# Patient Record
Sex: Female | Born: 1955 | Hispanic: No | Marital: Single | Smoking: Never smoker
Health system: Southern US, Community
[De-identification: ages and names within clinical notes are randomized; demographics above are authoritative.]

## PROBLEM LIST (undated history)

## (undated) HISTORY — PX: APPENDECTOMY: SHX54

---

## 1999-04-11 ENCOUNTER — Other Ambulatory Visit: Admission: RE | Admit: 1999-04-11 | Discharge: 1999-04-11 | Payer: Self-pay | Admitting: Obstetrics and Gynecology

## 2001-03-16 ENCOUNTER — Other Ambulatory Visit: Admission: RE | Admit: 2001-03-16 | Discharge: 2001-03-16 | Payer: Self-pay | Admitting: Obstetrics and Gynecology

## 2002-08-25 ENCOUNTER — Other Ambulatory Visit: Admission: RE | Admit: 2002-08-25 | Discharge: 2002-08-25 | Payer: Self-pay | Admitting: Obstetrics and Gynecology

## 2004-06-20 ENCOUNTER — Other Ambulatory Visit: Admission: RE | Admit: 2004-06-20 | Discharge: 2004-06-20 | Payer: Self-pay | Admitting: Obstetrics and Gynecology

## 2008-10-25 ENCOUNTER — Encounter: Admission: RE | Admit: 2008-10-25 | Discharge: 2008-10-25 | Payer: Self-pay | Admitting: Obstetrics and Gynecology

## 2016-02-11 ENCOUNTER — Ambulatory Visit (INDEPENDENT_AMBULATORY_CARE_PROVIDER_SITE_OTHER): Payer: 59 | Admitting: Family Medicine

## 2016-02-11 VITALS — BP 120/80 | HR 90 | Temp 98.3°F | Resp 16 | Ht 66.0 in | Wt 143.0 lb

## 2016-02-11 DIAGNOSIS — L989 Disorder of the skin and subcutaneous tissue, unspecified: Secondary | ICD-10-CM

## 2016-02-11 NOTE — Progress Notes (Signed)
   Patient ID: Jaclyn CallerJodie Wolken, female    DOB: 06/21/1955, 60 y.o.   MRN: [id number]  PCP: No primary care provider on file.  Chief Complaint  Patient presents with  . Rash    X 5 days on bottom area     Subjective:   HPI 60 year old presents for evaluation of rash times last Wednesday. Small rash on right buttock cheek.No new undergarment Nonpainful and itches only and doesn't burn. She reports recent work stressors and denies ever having a fever blister  Or similar lesion before. She has been married for 31 years and reports no changes in sexual partners.    Social History   Social History  . Marital status: Single    Spouse name: N/A  . Number of children: N/A  . Years of education: N/A   Occupational History  . Not on file.   Social History Main Topics  . Smoking status: Never Smoker  . Smokeless tobacco: Never Used  . Alcohol use No  . Drug use: No  . Sexual activity: Not on file   Other Topics Concern  . Not on file   Social History Narrative  . No narrative on file   Family History  Problem Relation Age of Onset  . Cancer Father   . Cancer Maternal Grandmother     Review of Systems See HPI  There are no active problems to display for this patient.   Prior to Admission medications   Not on File     No Known Allergies     Objective:  Physical Exam  Constitutional: She is oriented to person, place, and time. She appears well-developed and well-nourished.  HENT:  Head: Normocephalic and atraumatic.  Right Ear: External ear normal.  Left Ear: External ear normal.  Eyes: Conjunctivae are normal. Pupils are equal, round, and reactive to light.  Neck: Normal range of motion. Neck supple.  Cardiovascular: Normal rate, regular rhythm, normal heart sounds and intact distal pulses.   Pulmonary/Chest: Effort normal and breath sounds normal.  Musculoskeletal: Normal range of motion.  Neurological: She is alert and oriented to person, place, and time.    Skin: Skin is warm and dry. Rash noted. There is erythema.  Circular vesicular lesion on right inner gluteal fold. Non papule patch distal to vesicular lesion  Unroofed lesion and obtained a viral and bacterial culture.   Psychiatric: She has a normal mood and affect. Her behavior is normal. Judgment and thought content normal.      Assessment & Plan:  1. Skin lesion, superficial, vesicular lesions distributed in a circular pattern with a erythematous base. Exudate is non-purulent. Will treat as a dermatitis infection. - Herpes simplex virus culture-negative - WOUND CULTURE-pending   Plan: Clotrimazole-betamethasone cream apply to lesion twice daily for 10-14 days.  Follow-up if rash worsens or does not resolve.  Godfrey PickKimberly S. Tiburcio PeaHarris, MSN, FNP-C [facility]

## 2016-02-11 NOTE — Patient Instructions (Addendum)
To control itching, okay to use diphenhydramine topical lotion.  I will call you with the result of your skin culture.  Keep area clean and dry to prevent infection.  IF you received an x-ray today, you will receive an invoice from [facility]. Please contact [facility] at [phone] with questions or concerns regarding your invoice.   IF you received labwork today, you will receive an invoice from [employer]. Please contact [facility] at [phone] with questions or concerns regarding your invoice.   Our billing staff will not be able to assist you with questions regarding bills from these companies.  You will be contacted with the lab results as soon as they are available. The fastest way to get your results is to activate your My Chart account. Instructions are located on the last page of this paperwork. If you have not heard from [place] regarding the results in 2 weeks, please contact this office.    Herpes Simplex Test There are two common types of herpes simplex virus (HSV). These are classified as Type 1 (HSV1) or Type 2 (HSV2). Type 1 often causes cold sores on or around the mouth and sometimes on or around the eyes. Type 2 is commonly known as a sexually transmitted infection that causes sores in and around the genitals. Both types of herpes simplex can cause sores in different areas. There are two types of herpes simplex tests. These include:  Culture. This consists of collecting and testing a sample of fluid with a cotton swab from an open sore. This can only be done during an active infection (outbreak). Culture tests take several days to complete but are very accurate.  HSV blood tests. This test is not as accurate as a culture. However, HSV blood tests are faster than cultures, often providing a test result within one day.  HSV antibody test. This checks for the presence of antibodies against HSV in your blood. Antibodies are proteins  your body makes to help fight infection.  HSV antigen test. This checks for the presence of the HSV germ (antigen) in your blood. Your health care provider may recommend you have a HSV test if:  He or she believes you have a HSV infection.  You have a weakened immune system (immunocompromised) and you have sores around your mouth or genitals that look like HSV eruptions.  You have a fever of unknown origin (FUO).  You are pregnant, have herpes, and are expecting to deliver a baby vaginally in the next 6-8 weeks. RESULTS It is your responsibility to obtain your test results. Ask the lab or department performing the test when and how you will get your results. Contact your health care provider to discuss any questions you have about your results. Range of Normal Values Ranges for normal values may vary among different labs and hospitals. You should always check with your health care provider after having lab work or other tests done to discuss whether your values are considered within normal limits. Normal findings include:  No HSV antigen or antibodies present in your blood.  No HSV antigen present in cultured fluid. Meaning of Results Outside Normal Ranges The following test results may indicate that you have an active HSV infection:  Positive culture for HSV1 or HSV2.  Presence of HSV1 or HSV2 antigens in your blood.  Presence of certain HSV1 or HSV2 antibodies (IgM) in your blood. Discuss your test results with your health care provider. He or she will use the results to  make a diagnosis and determine a treatment plan that is right for you.   This information is not intended to replace advice given to you by your health care provider. Make sure you discuss any questions you have with your health care provider.   Document Released: 06/14/2004 Document Revised: 06/02/2014 Document Reviewed: 09/27/2013 Elsevier Interactive Patient Education [employer]2016 [employer]vier Inc.

## 2016-02-13 ENCOUNTER — Telehealth: Payer: Self-pay | Admitting: Family Medicine

## 2016-02-13 LAB — HERPES SIMPLEX VIRUS CULTURE: ORGANISM ID, BACTERIA: NOT DETECTED

## 2016-02-13 MED ORDER — CLOTRIMAZOLE-BETAMETHASONE 1-0.05 % EX CREA: 1.0000 "application " | TOPICAL_CREAM | Freq: Two times a day (BID) | CUTANEOUS | 0 refills | Status: AC

## 2016-02-13 NOTE — Telephone Encounter (Signed)
Call patient to advise that herpes culture was negative.  I am going to treat her rash as a contact dermatitis with Lotrisone cream applied to lesion twice daily.  If rash worsens or doesn't resolve, advise patient to return to the office for follow-up.  Jaclyn PickKimberly S. Tiburcio PeaHarris, MSN, FNP-C [facility]

## 2016-02-14 LAB — WOUND CULTURE
GRAM STAIN: NONE SEEN
GRAM STAIN: NONE SEEN
Gram Stain: NONE SEEN
Organism ID, Bacteria: NO GROWTH

## 2016-02-25 NOTE — Telephone Encounter (Signed)
Pt. Advised. She states it is almost completely cleared up.

## 2016-02-25 NOTE — Telephone Encounter (Signed)
Left message to call back  

## 2017-03-03 ENCOUNTER — Other Ambulatory Visit: Payer: Self-pay | Admitting: Obstetrics and Gynecology

## 2017-03-03 DIAGNOSIS — R928 Other abnormal and inconclusive findings on diagnostic imaging of breast: Secondary | ICD-10-CM

## 2017-03-04 ENCOUNTER — Ambulatory Visit
Admission: RE | Admit: 2017-03-04 | Discharge: 2017-03-04 | Disposition: A | Discharge status: Home / Self Care | Payer: 59 | Source: Ambulatory Visit | Attending: Obstetrics and Gynecology | Admitting: Obstetrics and Gynecology

## 2017-03-04 DIAGNOSIS — R928 Other abnormal and inconclusive findings on diagnostic imaging of breast: Secondary | ICD-10-CM

## 2018-06-21 IMAGING — MG 2D DIGITAL DIAGNOSTIC UNILATERAL RIGHT MAMMOGRAM WITH CAD AND AD
2 series · 3 of 6 positions shown · non-contrast
Comparison: Previous exam(s).

CLINICAL DATA: 52-year-old female presenting for recall from
screening mammography for possible right breast mass.

EXAM:
2D DIGITAL DIAGNOSTIC UNILATERAL RIGHT MAMMOGRAM WITH CAD AND
ADJUNCT TOMO
RIGHT BREAST ULTRASOUND

[R MLO]
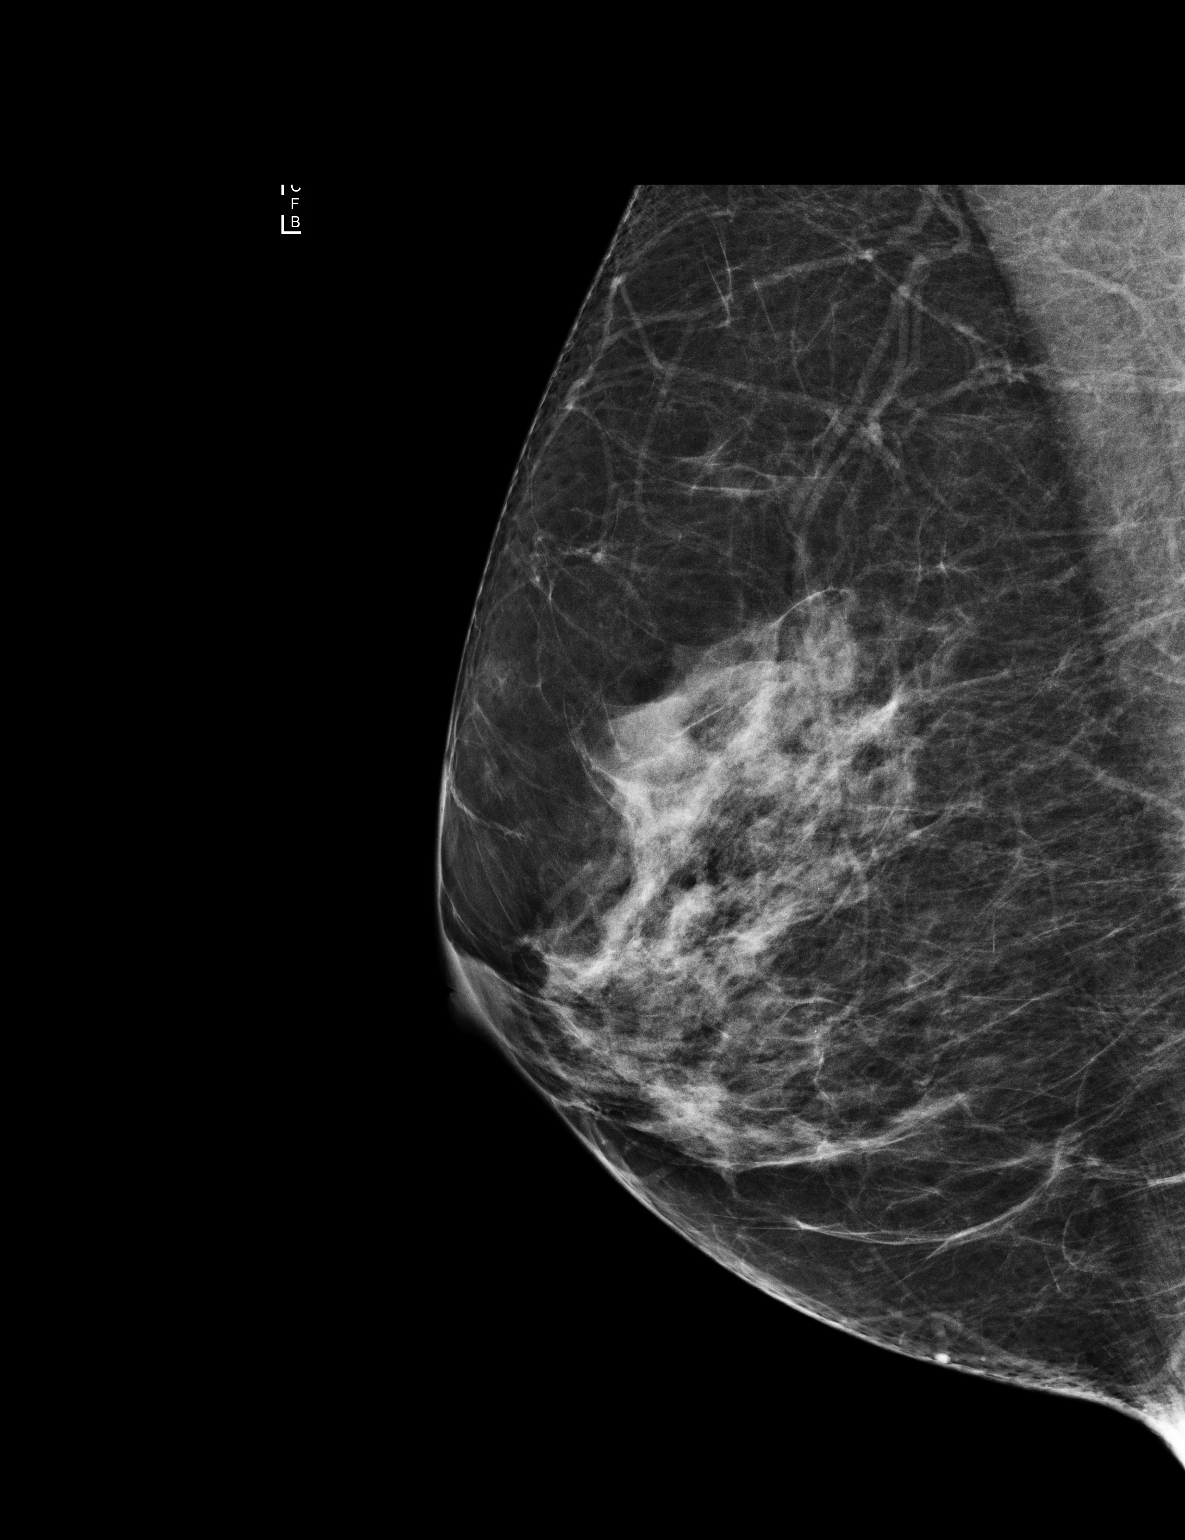

[R MLO tomo · 2 of 63 frames shown]
[frame 21/63]
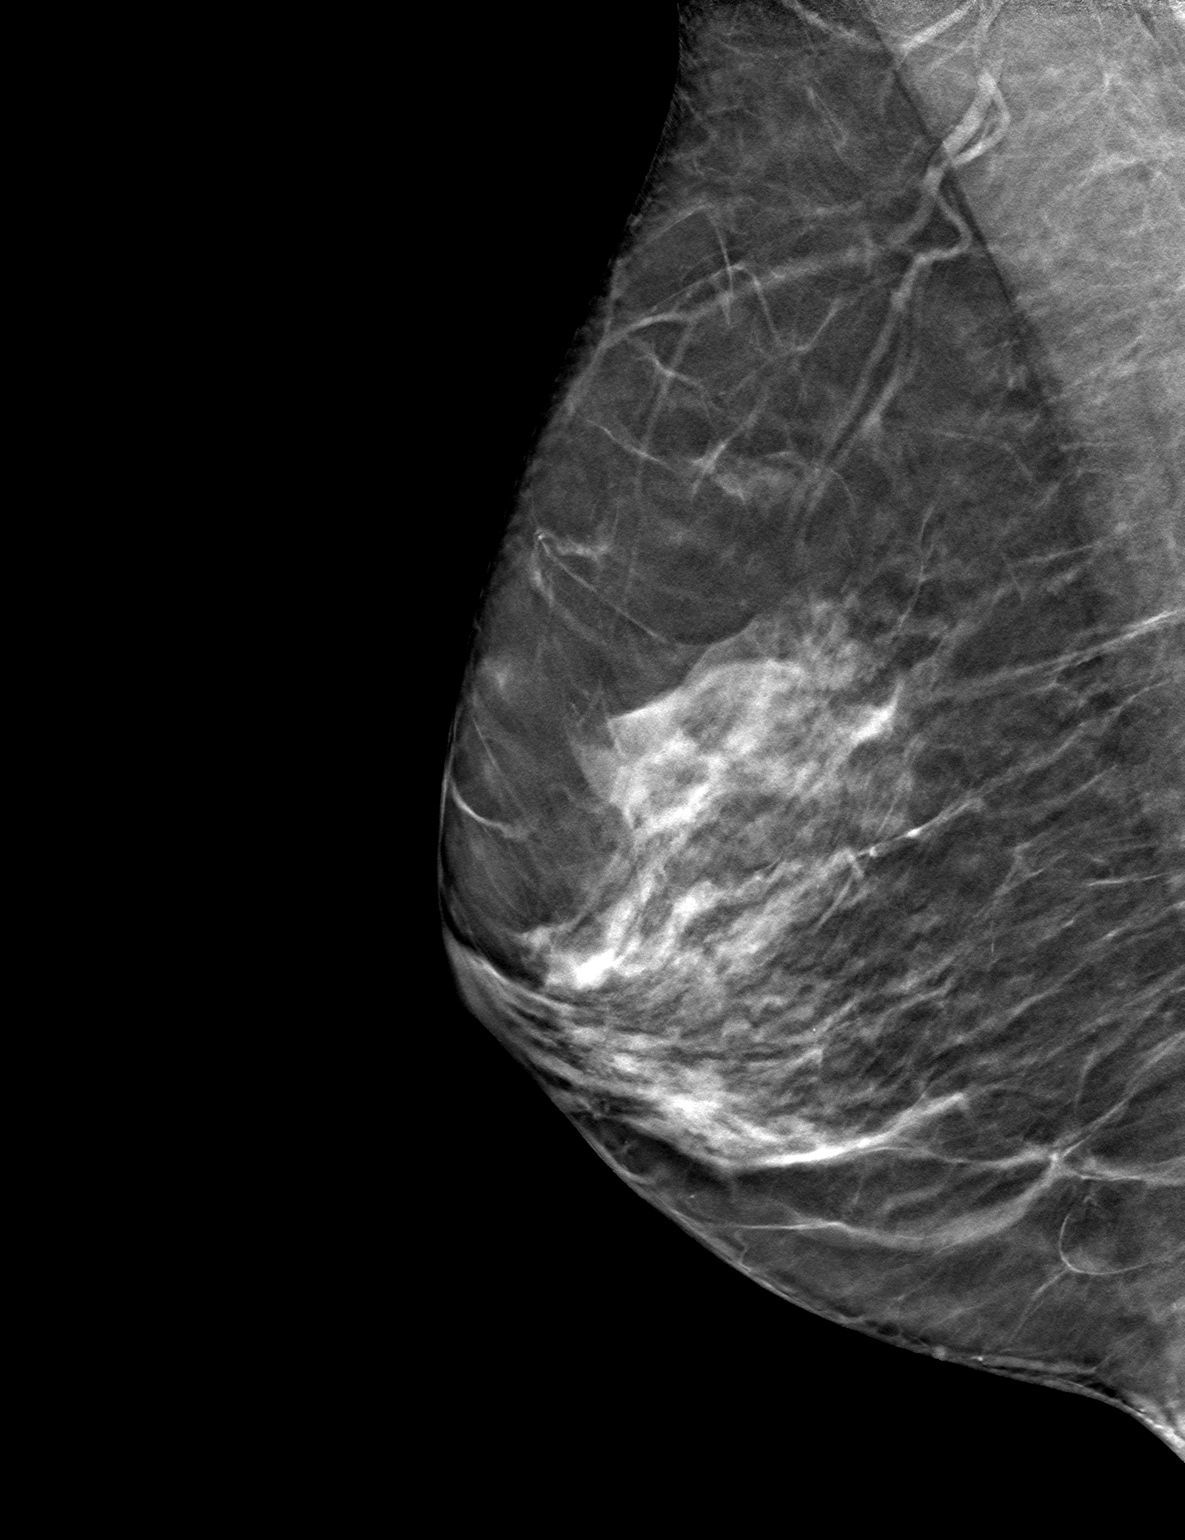
[frame 32/63]
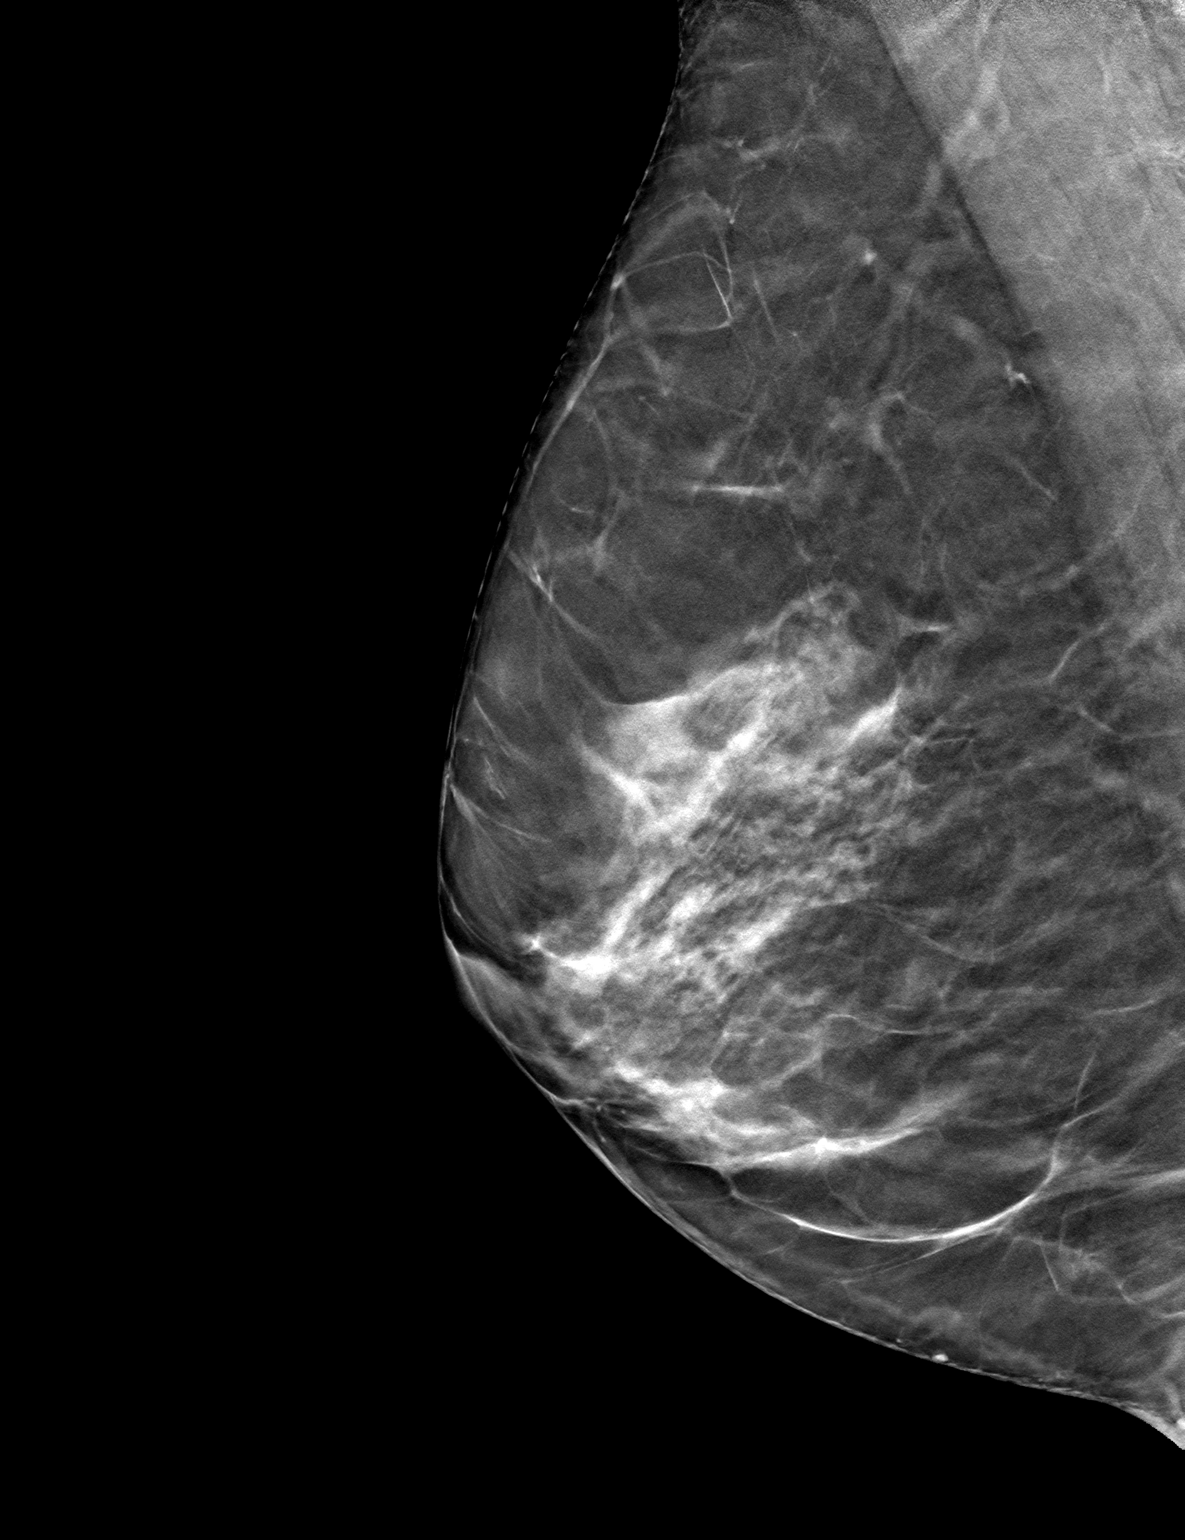

[3 of 6 positions shown; findings below may reference images not displayed]

ACR Breast Density Category c: The breast tissue is heterogeneously
dense, which may obscure small masses.
FINDINGS: In the inferior right breast, middle depth there is a possible 3 mm
mass.

Mammographic images were processed with CAD.

Ultrasound targeted to the right breast is 6 o'clock, 1 cm from the
nipple demonstrates an anechoic circumscribed oval mass measuring 4
x 2 x 3 mm.
IMPRESSION: The mass in the right breast at 6 o'clock is compatible with a
benign cyst.

RECOMMENDATION:
Screening mammogram in one year.(Code:NH-C-NU8)

I have discussed the findings and recommendations with the patient.
Results were also provided in writing at the conclusion of the
visit. If applicable, a reminder letter will be sent to the patient
regarding the next appointment.

BI-RADS CATEGORY  2: Benign.

## 2018-06-21 IMAGING — US ULTRASOUND RIGHT BREAST LIMITED
1 series · 6 of 6 positions shown · non-contrast
Comparison: Previous exam(s).

CLINICAL DATA: 52-year-old female presenting for recall from
screening mammography for possible right breast mass.

EXAM:
2D DIGITAL DIAGNOSTIC UNILATERAL RIGHT MAMMOGRAM WITH CAD AND
ADJUNCT TOMO
RIGHT BREAST ULTRASOUND

[Series 1: ultrasound right breast limited · 0.04mm/px · 6 of 6 slices shown]
[im 1/6]
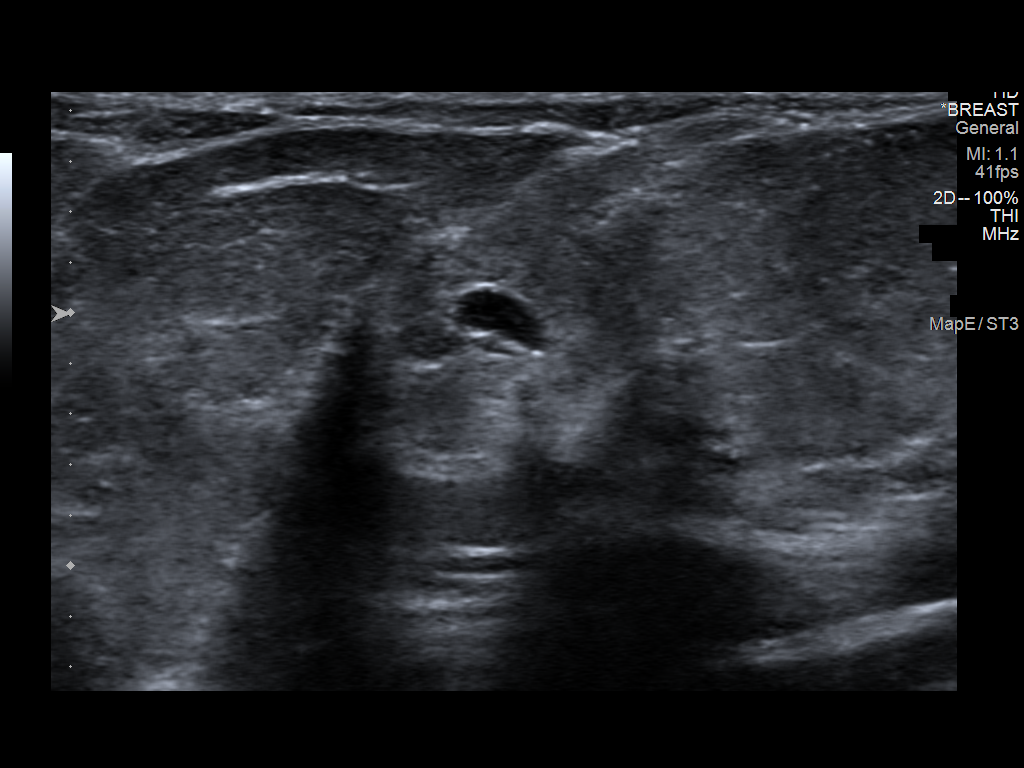
[im 2/6]
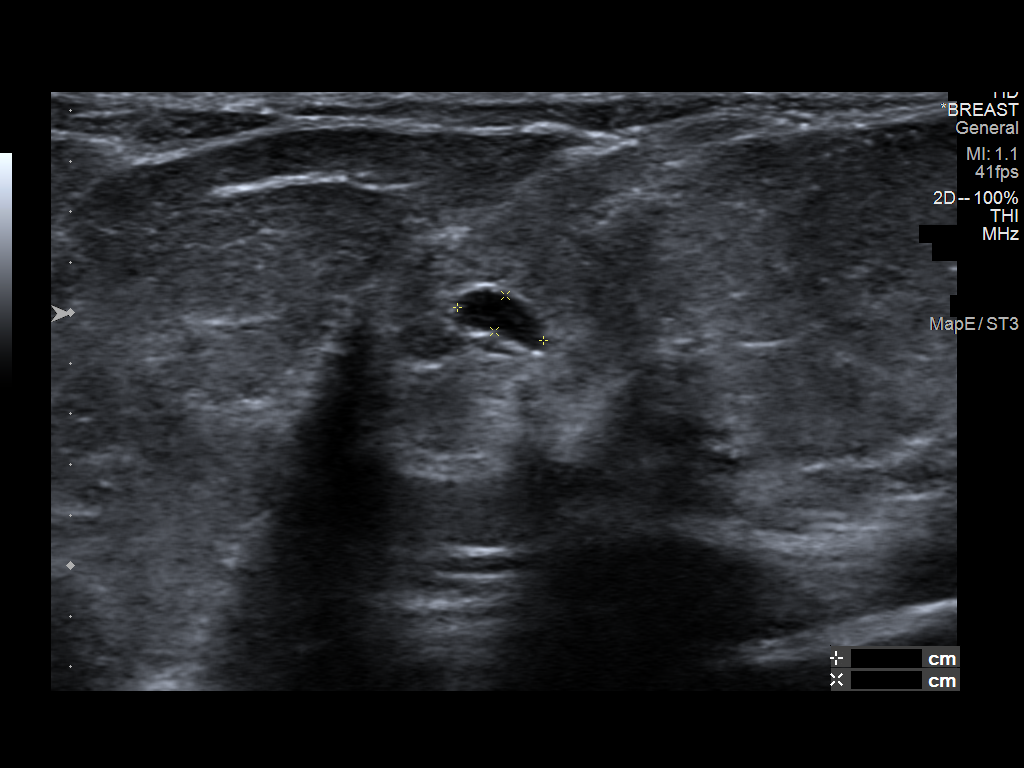
[im 3/6]
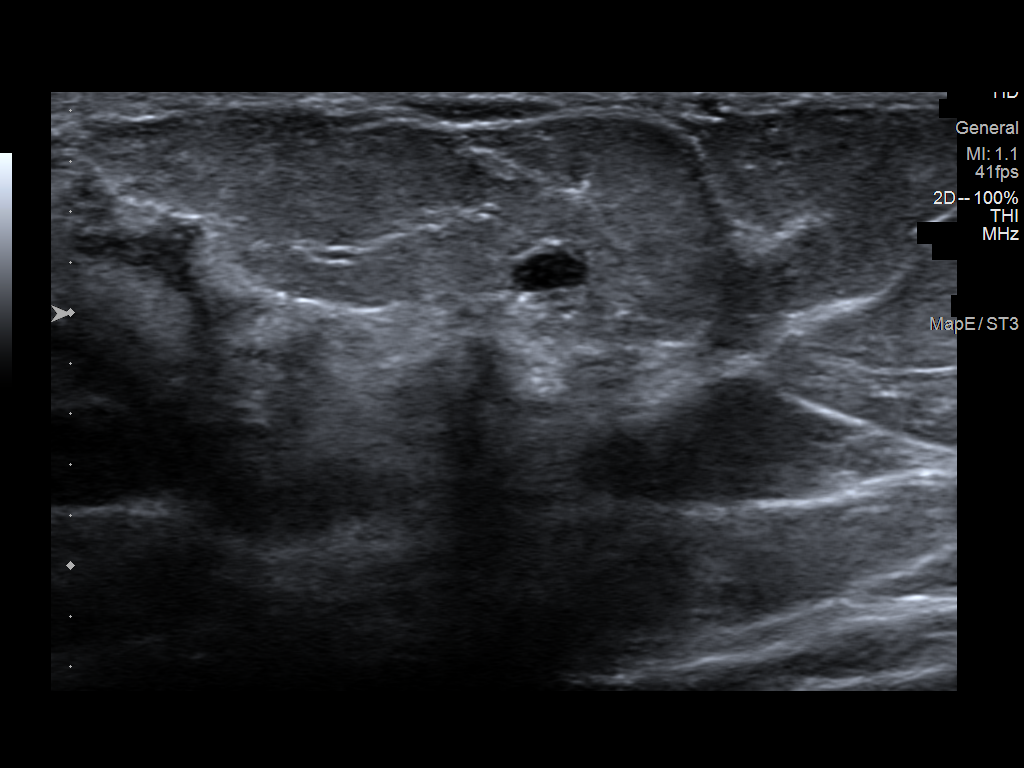
[im 4/6]
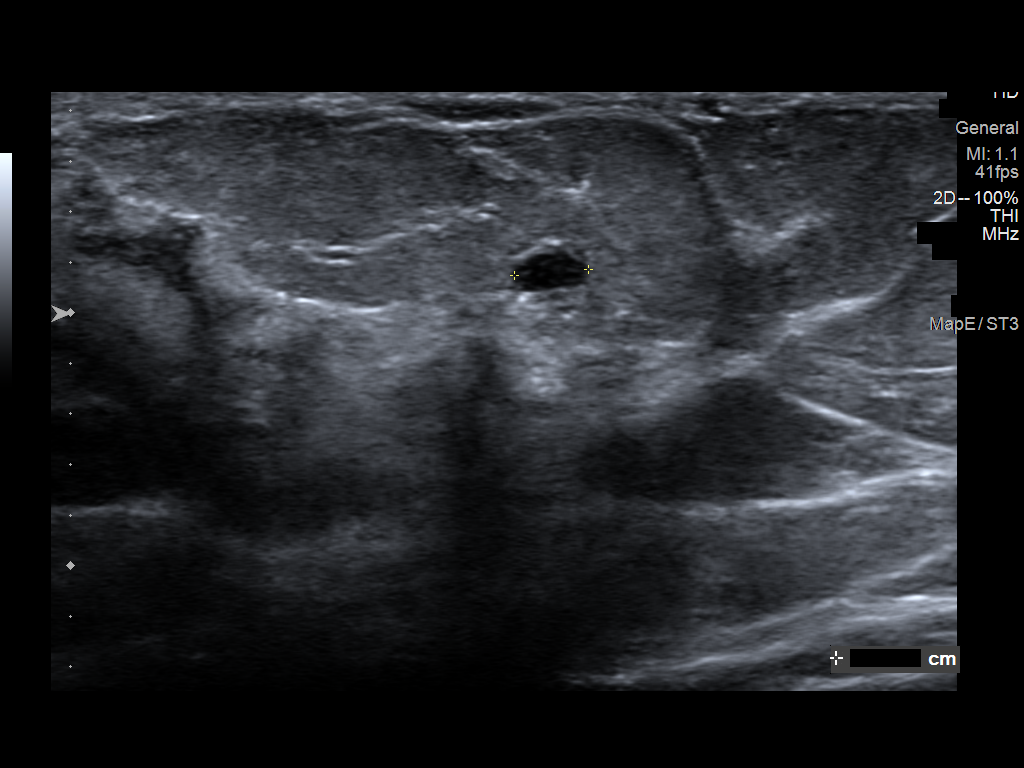
[im 5/6]
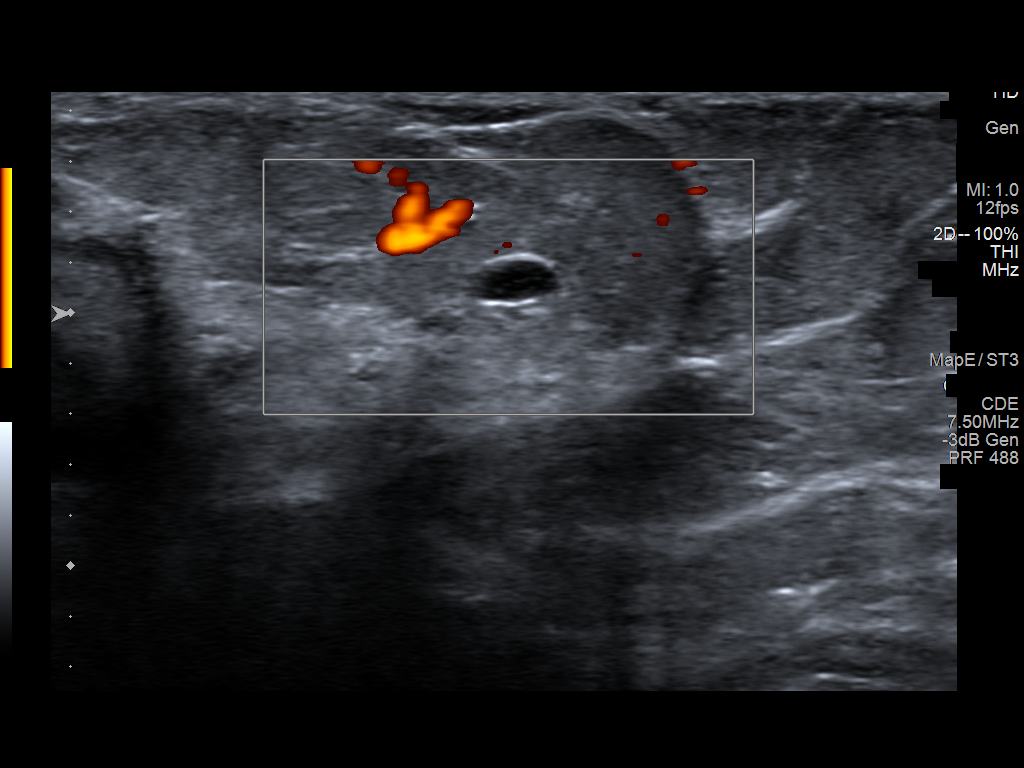
[im 6/6]
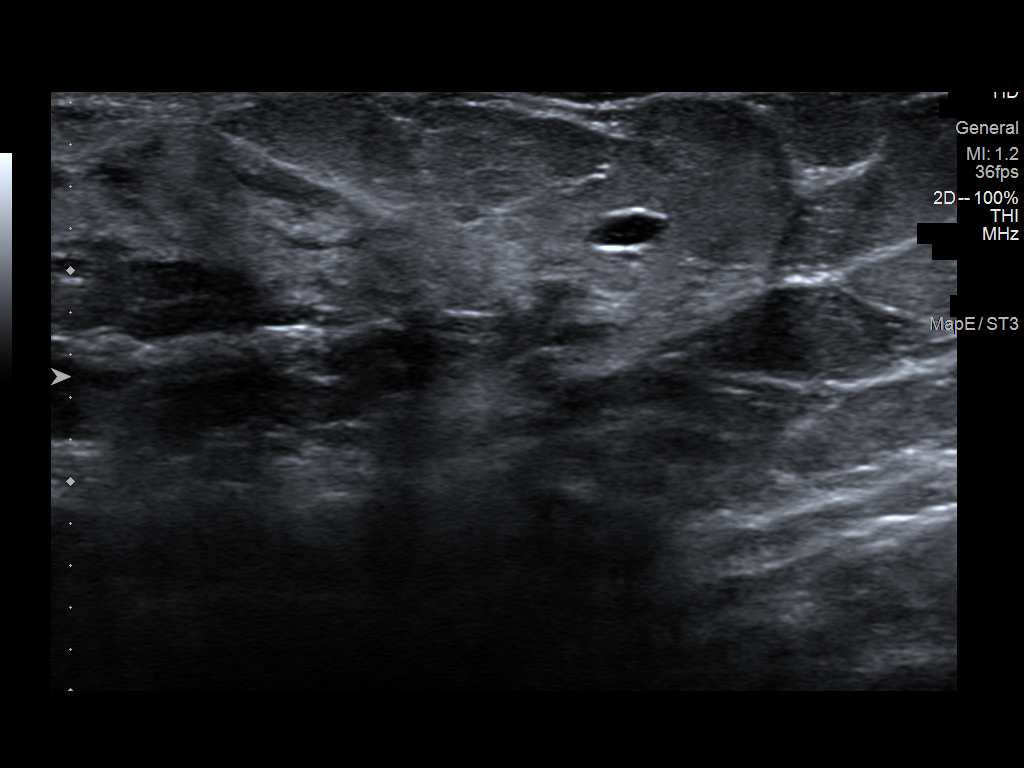

[6 of 6 positions shown; findings below may reference images not displayed]

ACR Breast Density Category c: The breast tissue is heterogeneously
dense, which may obscure small masses.
FINDINGS: In the inferior right breast, middle depth there is a possible 3 mm
mass.

Mammographic images were processed with CAD.

Ultrasound targeted to the right breast is 6 o'clock, 1 cm from the
nipple demonstrates an anechoic circumscribed oval mass measuring 4
x 2 x 3 mm.
IMPRESSION: The mass in the right breast at 6 o'clock is compatible with a
benign cyst.

RECOMMENDATION:
Screening mammogram in one year.(Code:NH-C-NU8)

I have discussed the findings and recommendations with the patient.
Results were also provided in writing at the conclusion of the
visit. If applicable, a reminder letter will be sent to the patient
regarding the next appointment.

BI-RADS CATEGORY  2: Benign.

## 2019-06-10 ENCOUNTER — Ambulatory Visit: Payer: BC Managed Care – PPO | Attending: Internal Medicine

## 2019-06-10 DIAGNOSIS — Z20822 Contact with and (suspected) exposure to covid-19: Secondary | ICD-10-CM

## 2019-06-11 LAB — NOVEL CORONAVIRUS, NAA: SARS-CoV-2, NAA: NOT DETECTED

## 2021-10-22 LAB — EXTERNAL GENERIC LAB PROCEDURE: COLOGUARD: NEGATIVE

## 2022-03-20 ENCOUNTER — Ambulatory Visit
Admission: EM | Admit: 2022-03-20 | Discharge: 2022-03-20 | Disposition: A | Discharge status: Home / Self Care | Payer: 59 | Attending: Physician Assistant | Admitting: Physician Assistant

## 2022-03-20 ENCOUNTER — Other Ambulatory Visit: Payer: Self-pay

## 2022-03-20 ENCOUNTER — Encounter: Payer: Self-pay | Admitting: Emergency Medicine

## 2022-03-20 DIAGNOSIS — F411 Generalized anxiety disorder: Secondary | ICD-10-CM | POA: Diagnosis not present

## 2022-03-20 DIAGNOSIS — R000 Tachycardia, unspecified: Secondary | ICD-10-CM | POA: Diagnosis not present

## 2022-03-20 MED ORDER — METOPROLOL SUCCINATE ER 25 MG PO TB24: 25.0000 mg | ORAL_TABLET | Freq: Every day | ORAL | 2 refills | Status: AC

## 2022-03-20 NOTE — Discharge Instructions (Signed)
Advised to start the metoprolol XL 25 mg once daily to help decrease the rapid heart rate and reduce stress. Advised to reduce the caffeine intake on a daily basis. Advised to try and reduce the amount of stress that you are under on a regular basis which will help improve and resolve symptoms and help to control blood pressure. Is to follow-up with PCP or return to urgent care if symptoms fail to improve.  Make sure to schedule an appointment to see your PCP provider within the next 3 to 4 weeks in order to have the blood pressure reevaluated and to reassess how the metoprolol is doing controlling her symptoms.

## 2022-03-20 NOTE — ED Provider Notes (Addendum)
[facility]    CSN: [id number] Arrival date & time: 03/20/22  0909      History   Chief Complaint Chief Complaint  Patient presents with   Tachycardia    HPI Jaclyn Arias is a 66 y.o. female.   33-year-old female presents with rapid heart rate and anxiety.  Patient indicates for the past several days she has been experiencing intermittent episodes of rapid heart rate.  She relates that it tends to come on last for several minutes to half an hour and then tends to resolve.  Patient indicates that it does occur multiple times throughout the day over the past week.  She indicates she does get some mild pressure sensation when it occurs but she is believes that this is more due to stress.  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  Patient also indicates that she is active in the church as well and also is caring for her grandchildren on a intermittent basis.  Patient indicates she does not smoke, and she only drinks 2 caffeinated drinks in the morning.  She does indicates she eats on the run at the job.  She indicates she has had similar episodes of the rapid heart rate about 15 years ago and she took some metoprolol which tend to control her symptoms.  She denies any chest pain, shortness of breath, nausea or vomiting, or diaphoresis.  She does indicate he has been a while since she has seen her PCP.     History reviewed. No pertinent past medical history.  There are no problems to display for this patient.   Past Surgical History:  Procedure Laterality Date   APPENDECTOMY      OB History   No obstetric history on file.      Home Medications    Prior to Admission medications   Medication Sig Start Date End Date Taking? Authorizing Provider  metoprolol succinate (TOPROL XL) 25 MG 24 hr tablet Take 1 tablet (25 mg total) by mouth  daily. 03/20/22  Yes Nyoka Lint, PA-C  clotrimazole-betamethasone (LOTRISONE) cream Apply 1 application topically 2 (two) times daily. 02/13/16   Scot Jun, FNP    Family History Family History  Problem Relation Age of Onset   Cancer Father    Cancer Maternal Grandmother     Social History Social History   Tobacco Use   Smoking status: Never   Smokeless tobacco: Never  Substance Use Topics   Alcohol use: No   Drug use: No     Allergies   Patient has no known allergies.   Review of Systems Review of Systems  Cardiovascular:  Positive for palpitations.     Physical Exam Triage Vital Signs ED Triage Vitals [03/20/22 0932]  Enc Vitals Group     BP (!) 168/93     Pulse Rate (!) 110     Resp 18     Temp 98 F (36.7 C)     Temp Source Oral     SpO2 96 %     Weight      Height      Head Circumference      Peak Flow      Pain Score 0     Pain Loc      Pain Edu?      Excl. in [place]?  No data found.  Updated Vital Signs BP (!) 168/93 (BP Location: Left Arm)   Pulse (!) 110   Temp 98 F (36.7 C) (Oral)   Resp 18   SpO2 96%   Visual Acuity Right Eye Distance:   Left Eye Distance:   Bilateral Distance:    Right Eye Near:   Left Eye Near:    Bilateral Near:     Physical Exam Constitutional:      Appearance: Normal appearance.  Cardiovascular:     Rate and Rhythm: Normal rate and regular rhythm.     Heart sounds: Normal heart sounds.  Pulmonary:     Effort: Pulmonary effort is normal.     Breath sounds: Normal breath sounds and air entry. No wheezing, rhonchi or rales.  Lymphadenopathy:     Cervical: No cervical adenopathy.  Neurological:     Mental Status: She is alert.      UC Treatments / Results  Labs (all labs ordered are listed, but only abnormal results are displayed) Labs Reviewed - No data to display  EKG: Sinus tachycardia, normal EKG without acute changes today.   Radiology No results  found.  Procedures Procedures (including critical care time)  Medications Ordered in UC Medications - No data to display  Initial Impression / Assessment and Plan / UC Course  I have reviewed the triage vital signs and the nursing notes.  Pertinent labs & imaging results that were available during my care of the patient were reviewed by me and considered in my medical decision making (see chart for details).    Plan: 1.  The tachycardia will be treated with the following: A.  Metoprolol XL 25 mg once daily to treat the tachycardia. 2.  Anxiety state will be treated with the following: A.  Metoprolol XL 25 mg once daily to treat the anxiety. B.  Patient advised to try and reduce the amount of stress she is under on a daily basis as this will help reduce the blood pressure and help to resolve the rapid heart rate episodes. 3.  Advised to become established with PCP and be reevaluated within the next 3 to 4 weeks for recheck on blood pressure and to see how the medication is controlling her symptoms. .  Advised to return to urgent care as needed. Final Clinical Impressions(s) / UC Diagnoses   Final diagnoses:  Tachycardia  Anxiety state     Discharge Instructions      Advised to start the metoprolol XL 25 mg once daily to help decrease the rapid heart rate and reduce stress. Advised to reduce the caffeine intake on a daily basis. Advised to try and reduce the amount of stress that you are under on a regular basis which will help improve and resolve symptoms and help to control blood pressure. Is to follow-up with PCP or return to urgent care if symptoms fail to improve.  Make sure to schedule an appointment to see your PCP provider within the next 3 to 4 weeks in order to have the blood pressure reevaluated and to reassess how the metoprolol is doing controlling her symptoms.    ED Prescriptions     Medication Sig Dispense Auth. Provider   metoprolol succinate (TOPROL XL) 25  MG 24 hr tablet Take 1 tablet (25 mg total) by mouth daily. 30 tablet Nyoka Lint, PA-C      PDMP not reviewed this encounter.   Nyoka Lint, PA-C 03/20/22 1001    Nyoka Lint, PA-C 03/20/22  1228  

## 2022-03-20 NOTE — ED Triage Notes (Signed)
Pt here for tachycardia and palpitations starting this am; pt sts hx of same many years ago given a beta blocker

## 2023-10-07 DIAGNOSIS — R300 Dysuria: Secondary | ICD-10-CM | POA: Diagnosis not present

## 2023-10-07 DIAGNOSIS — Z01411 Encounter for gynecological examination (general) (routine) with abnormal findings: Secondary | ICD-10-CM | POA: Diagnosis not present

## 2023-10-07 DIAGNOSIS — Z1231 Encounter for screening mammogram for malignant neoplasm of breast: Secondary | ICD-10-CM | POA: Diagnosis not present

## 2023-11-05 DIAGNOSIS — E039 Hypothyroidism, unspecified: Secondary | ICD-10-CM | POA: Diagnosis not present

## 2023-11-05 DIAGNOSIS — I10 Essential (primary) hypertension: Secondary | ICD-10-CM | POA: Diagnosis not present

## 2023-11-05 DIAGNOSIS — E782 Mixed hyperlipidemia: Secondary | ICD-10-CM | POA: Diagnosis not present

## 2023-11-05 DIAGNOSIS — Z1331 Encounter for screening for depression: Secondary | ICD-10-CM | POA: Diagnosis not present
